# Patient Record
Sex: Male | Born: 1950 | Race: Black or African American | Hispanic: No | Smoking: Former smoker
Health system: Southern US, Community
[De-identification: ages and names within clinical notes are randomized; demographics above are authoritative.]

## PROBLEM LIST (undated history)

## (undated) DIAGNOSIS — H409 Unspecified glaucoma: Secondary | ICD-10-CM

## (undated) DIAGNOSIS — M1990 Unspecified osteoarthritis, unspecified site: Secondary | ICD-10-CM

## (undated) HISTORY — PX: LUNG SURGERY: SHX703

## (undated) HISTORY — PX: LUNG REMOVAL, PARTIAL: SHX233

## (undated) HISTORY — PX: EYE SURGERY: SHX253

---

## 2002-03-02 ENCOUNTER — Encounter: Payer: Self-pay | Admitting: Orthopaedic Surgery

## 2002-03-02 ENCOUNTER — Ambulatory Visit (HOSPITAL_COMMUNITY): Admission: RE | Admit: 2002-03-02 | Discharge: 2002-03-02 | Payer: Self-pay | Admitting: Orthopaedic Surgery

## 2002-10-07 ENCOUNTER — Emergency Department (HOSPITAL_COMMUNITY): Admission: EM | Admit: 2002-10-07 | Discharge: 2002-10-07 | Payer: Self-pay | Admitting: Emergency Medicine

## 2002-10-07 ENCOUNTER — Encounter: Payer: Self-pay | Admitting: Emergency Medicine

## 2017-11-19 ENCOUNTER — Other Ambulatory Visit: Payer: Self-pay | Admitting: Podiatry

## 2017-11-19 ENCOUNTER — Ambulatory Visit (INDEPENDENT_AMBULATORY_CARE_PROVIDER_SITE_OTHER): Payer: Non-veteran care

## 2017-11-19 ENCOUNTER — Encounter: Payer: Self-pay | Admitting: Podiatry

## 2017-11-19 ENCOUNTER — Ambulatory Visit (INDEPENDENT_AMBULATORY_CARE_PROVIDER_SITE_OTHER): Payer: Non-veteran care | Admitting: Podiatry

## 2017-11-19 VITALS — BP 191/94 | HR 63 | Resp 16 | Ht 69.0 in | Wt 192.0 lb

## 2017-11-19 DIAGNOSIS — M2142 Flat foot [pes planus] (acquired), left foot: Principal | ICD-10-CM

## 2017-11-19 DIAGNOSIS — M2042 Other hammer toe(s) (acquired), left foot: Secondary | ICD-10-CM

## 2017-11-19 DIAGNOSIS — M2041 Other hammer toe(s) (acquired), right foot: Secondary | ICD-10-CM

## 2017-11-19 DIAGNOSIS — M2141 Flat foot [pes planus] (acquired), right foot: Secondary | ICD-10-CM | POA: Diagnosis not present

## 2017-11-19 DIAGNOSIS — M722 Plantar fascial fibromatosis: Secondary | ICD-10-CM | POA: Diagnosis not present

## 2017-11-21 NOTE — Progress Notes (Signed)
Subjective:   Patient ID: Jason Hancock, male   DOB: 67 y.o.   MRN: [id number]   HPI 67 year old male presents the office today for concerns of pain to both of his feet.  He states that when he was in the [employer] he had something made to put inside his shoes to help with his arches.  He has not had any recently and he states that he gets pain to the arch of his foot mostly with prolonged walking and standing.  He also gets some numbness and tingling to his feet which is been ongoing since the [employer].  This is not a new issue for him.  He does have history of surgery on his feet.  Denies any swelling or redness.  No recent injury.  No other concerns.   Review of Systems  All other systems reviewed and are negative.  No past medical history on file.     Current Outpatient Medications:  .  ARIPiprazole (ABILIFY) 2 MG tablet, Take 2 mg by mouth daily., Disp: , Rfl:  .  brimonidine (ALPHAGAN) 0.2 % ophthalmic solution, 3 (three) times daily., Disp: , Rfl:  .  budesonide-formoterol (SYMBICORT) 160-4.5 MCG/ACT inhaler, Inhale 2 puffs into the lungs 2 (two) times daily., Disp: , Rfl:  .  fluticasone (FLOVENT DISKUS) 50 MCG/BLIST diskus inhaler, Inhale 1 puff into the lungs 2 (two) times daily., Disp: , Rfl:  .  gabapentin (NEURONTIN) 300 MG capsule, Take 300 mg by mouth 3 (three) times daily., Disp: , Rfl:  .  loratadine (CLARITIN) 10 MG tablet, Take 10 mg by mouth daily., Disp: , Rfl:  .  omeprazole (PRILOSEC) 20 MG capsule, Take 20 mg by mouth daily., Disp: , Rfl:  .  oxycodone (OXY-IR) 5 MG capsule, Take 5 mg by mouth every 4 (four) hours as needed., Disp: , Rfl:  .  sertraline (ZOLOFT) 100 MG tablet, Take 100 mg by mouth daily., Disp: , Rfl:  .  urea (CARMOL) 40 % CREA, Apply topically daily., Disp: , Rfl:   Not on File     Objective:  Physical Exam  General: AAO x3, NAD  Dermatological: Skin is warm, dry and supple bilateral. Nails x 10 are well manicured; remaining integument  appears unremarkable at this time. There are no open sores, no preulcerative lesions, no rash or signs of infection present.  Vascular: Dorsalis Pedis artery and Posterior Tibial artery pedal pulses are 2/4 bilateral with immedate capillary fill time.  There is no pain with calf compression, swelling, warmth, erythema.   Neruologic: Grossly intact via light touch bilateral. Vibratory intact via tuning fork bilateral. Protective threshold with Semmes Wienstein monofilament intact to all pedal sites bilateral. Negative tinel sign.   Musculoskeletal: Significant decrease in medial arch upon weightbearing calcaneal valgus is present.  Subjectively there is discomfort on the medial band plantar fascia the arch of the foot. Achilles tendon/plantar fascial appear intact. There is no area of pinpoint tenderness. No edema.  muscular strength 5/5 in all groups tested bilateral.  Hammertoes present.  Gait: Unassisted, Nonantalgic.      Assessment:   67 year old male with symptomatic flatfoot deformity.    Plan:  -Treatment options discussed including all alternatives, risks, and complications -Etiology of symptoms were discussed -X-rays were obtained and reviewed with the patient.  No evidence of acute fracture or stress fracture identified today. -I do think he will benefit from new custom molded orthotics and he states he had these previously.  [facility]  We discussed shoe modifications. -He has ongoing nerve symptoms as an ongoing for several years.  He is on gabapentin and continue with this.  Trula Slade DPM

## 2017-11-25 ENCOUNTER — Ambulatory Visit: Payer: Non-veteran care | Admitting: Orthotics

## 2017-11-25 DIAGNOSIS — M2041 Other hammer toe(s) (acquired), right foot: Secondary | ICD-10-CM

## 2017-11-25 DIAGNOSIS — M2042 Other hammer toe(s) (acquired), left foot: Secondary | ICD-10-CM

## 2017-11-25 DIAGNOSIS — M2142 Flat foot [pes planus] (acquired), left foot: Principal | ICD-10-CM

## 2017-11-25 DIAGNOSIS — M2141 Flat foot [pes planus] (acquired), right foot: Secondary | ICD-10-CM

## 2017-11-25 NOTE — Progress Notes (Signed)
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  Unable to offer service until we get PO from [place].

## 2017-12-07 ENCOUNTER — Ambulatory Visit (INDEPENDENT_AMBULATORY_CARE_PROVIDER_SITE_OTHER): Payer: Non-veteran care | Admitting: Orthotics

## 2017-12-07 DIAGNOSIS — M2141 Flat foot [pes planus] (acquired), right foot: Secondary | ICD-10-CM

## 2017-12-07 DIAGNOSIS — M722 Plantar fascial fibromatosis: Secondary | ICD-10-CM

## 2017-12-07 DIAGNOSIS — M2142 Flat foot [pes planus] (acquired), left foot: Principal | ICD-10-CM

## 2017-12-07 NOTE — Progress Notes (Signed)

## 2017-12-28 ENCOUNTER — Ambulatory Visit: Payer: Non-veteran care | Admitting: Orthotics

## 2017-12-28 DIAGNOSIS — M2142 Flat foot [pes planus] (acquired), left foot: Principal | ICD-10-CM

## 2017-12-28 DIAGNOSIS — M2141 Flat foot [pes planus] (acquired), right foot: Secondary | ICD-10-CM

## 2017-12-28 NOTE — Progress Notes (Signed)
Patient came in today to pick up custom made foot orthotics.  The goals were accomplished and the patient reported no dissatisfaction with said orthotics.  Patient was advised of breakin period and how to report any issues. 

## 2018-01-06 ENCOUNTER — Encounter (INDEPENDENT_AMBULATORY_CARE_PROVIDER_SITE_OTHER): Payer: Self-pay | Admitting: *Deleted

## 2018-01-31 ENCOUNTER — Emergency Department (HOSPITAL_COMMUNITY)
Admission: EM | Admit: 2018-01-31 | Discharge: 2018-01-31 | Disposition: A | Discharge status: Home / Self Care | Payer: Non-veteran care | Attending: Emergency Medicine | Admitting: Emergency Medicine

## 2018-01-31 ENCOUNTER — Encounter (HOSPITAL_COMMUNITY): Payer: Self-pay | Admitting: Emergency Medicine

## 2018-01-31 ENCOUNTER — Emergency Department (HOSPITAL_COMMUNITY): Payer: Non-veteran care

## 2018-01-31 ENCOUNTER — Other Ambulatory Visit: Payer: Self-pay

## 2018-01-31 DIAGNOSIS — J441 Chronic obstructive pulmonary disease with (acute) exacerbation: Secondary | ICD-10-CM | POA: Diagnosis not present

## 2018-01-31 DIAGNOSIS — Z79899 Other long term (current) drug therapy: Secondary | ICD-10-CM | POA: Insufficient documentation

## 2018-01-31 DIAGNOSIS — Z87891 Personal history of nicotine dependence: Secondary | ICD-10-CM | POA: Insufficient documentation

## 2018-01-31 DIAGNOSIS — R0602 Shortness of breath: Secondary | ICD-10-CM | POA: Diagnosis present

## 2018-01-31 HISTORY — DX: Unspecified osteoarthritis, unspecified site: M19.90

## 2018-01-31 LAB — BASIC METABOLIC PANEL
Anion gap: 5 (ref 5–15)
BUN: 12 mg/dL (ref 8–23)
CO2: 22 mmol/L (ref 22–32)
Calcium: 8.8 mg/dL — ABNORMAL LOW (ref 8.9–10.3)
Chloride: 110 mmol/L (ref 98–111)
Creatinine, Ser: 1.19 mg/dL (ref 0.61–1.24)
GFR calc Af Amer: >60 mL/min (ref >60)
GFR calc non Af Amer: >60 mL/min (ref >60)
Glucose, Bld: 147 mg/dL — ABNORMAL HIGH (ref 70–99)
Potassium: 3.8 mmol/L (ref 3.5–5.1)
Sodium: 137 mmol/L (ref 135–145)

## 2018-01-31 LAB — CBC WITH DIFFERENTIAL/PLATELET
Abs Immature Granulocytes: 0.03 10*3/uL (ref 0.00–0.07)
Basophils Absolute: 0 10*3/uL (ref 0.0–0.1)
Basophils Relative: 0 %
Eosinophils Absolute: 0.2 10*3/uL (ref 0.0–0.5)
Eosinophils Relative: 3 %
HCT: 40.9 % (ref 39.0–52.0)
Hemoglobin: 13.2 g/dL (ref 13.0–17.0)
Immature Granulocytes: 0 %
LYMPHS PCT: 40 %
Lymphs Abs: 2.8 10*3/uL (ref 0.7–4.0)
MCH: 24.8 pg — ABNORMAL LOW (ref 26.0–34.0)
MCHC: 32.3 g/dL (ref 30.0–36.0)
MCV: 76.7 fL — ABNORMAL LOW (ref 80.0–100.0)
Monocytes Absolute: 0.5 10*3/uL (ref 0.1–1.0)
Monocytes Relative: 8 %
Neutro Abs: 3.4 10*3/uL (ref 1.7–7.7)
Neutrophils Relative %: 49 %
Platelets: 186 10*3/uL (ref 150–400)
RBC: 5.33 MIL/uL (ref 4.22–5.81)
RDW: 16.6 % — ABNORMAL HIGH (ref 11.5–15.5)
WBC: 7 10*3/uL (ref 4.0–10.5)
nRBC: 0 % (ref 0.0–0.2)

## 2018-01-31 LAB — TROPONIN I: Troponin I: <0.03 ng/mL (ref <0.03)

## 2018-01-31 MED ORDER — ALBUTEROL SULFATE (2.5 MG/3ML) 0.083% IN NEBU
5.0000 mg | INHALATION_SOLUTION | Freq: Once | RESPIRATORY_TRACT | Status: AC
  Administered 2018-01-31: 5 mg via RESPIRATORY_TRACT
  Filled 2018-01-31: qty 6

## 2018-01-31 MED ORDER — PREDNISONE 20 MG PO TABS: 20.0000 mg | ORAL_TABLET | Freq: Two times a day (BID) | ORAL | 0 refills | Status: DC

## 2018-01-31 MED ORDER — PREDNISONE 50 MG PO TABS
60.0000 mg | ORAL_TABLET | Freq: Once | ORAL | Status: AC
  Administered 2018-01-31: 60 mg via ORAL
  Filled 2018-01-31: qty 1

## 2018-01-31 NOTE — ED Triage Notes (Signed)
Cough and sob x 7 days.  Pain in chest with deep breaths

## 2018-01-31 NOTE — Discharge Instructions (Signed)
Start the prednisone prescription, tomorrow.  Use your albuterol inhaler 2 puffs every 3-4 hours as needed for cough or trouble breathing.  Consider following up with your pulmonologist, in [place], [place], for checkup in 2 to 4 weeks.  Ask them to reevaluate you for an abnormal chest x-ray that we saw today.  Today the x-ray indicates that you may have chronic interstitial lung disease.

## 2018-01-31 NOTE — ED Provider Notes (Signed)
[facility] EMERGENCY DEPARTMENT Provider Note   CSN: [id number] Arrival date & time: 01/31/18  1706     History   Chief Complaint Chief Complaint  Patient presents with  . Shortness of Breath    HPI Jason Hancock is a 68 y.o. male.  HPI   He presents for evaluation of cough, with shortness of breath, for 1 week.  Has been exposed to "RSV," in some relatives.  He denies fever, chills, chest pain, weakness or dizziness.  He is using his usual medicines including Symbicort and albuterol inhalers, without relief.  He is an ex smoker.  He saw a healthcare provider today at a [place] clinic, and was instructed to come to the ED for further evaluation and treatment.  His cough is nonproductive.  He denies nausea, vomiting, headache, paresthesia or focal weakness.  He states that 2 months ago he had lung surgery to remove some nodules, that were found to be "rheumatoid arthritis."  There are no other known modifying factors.  Past Medical History:  Diagnosis Date  . Arthritis     There are no active problems to display for this patient.   Past Surgical History:  Procedure Laterality Date  . LUNG REMOVAL, PARTIAL          Home Medications    Prior to Admission medications   Medication Sig Start Date End Date Taking? Authorizing Provider  albuterol (PROAIR HFA) 108 (90 Base) MCG/ACT inhaler Inhale 1-2 puffs into the lungs every 6 (six) hours as needed for wheezing or shortness of breath.   Yes [provider]  ARIPiprazole (ABILIFY) 2 MG tablet Take 2 mg by mouth daily.   Yes [provider]  brimonidine (ALPHAGAN) 0.2 % ophthalmic solution Place 1 drop into the right eye 3 (three) times daily.    Yes [provider]  budesonide-formoterol (SYMBICORT) 160-4.5 MCG/ACT inhaler Inhale 2 puffs into the lungs daily as needed (for shortness of breath).    Yes [provider]  fluticasone (FLOVENT DISKUS) 50 MCG/BLIST diskus inhaler Inhale 1 puff into the  lungs daily as needed (for shortness of breath).    Yes [provider]  gabapentin (NEURONTIN) 600 MG tablet Take 600 mg by mouth 2 (two) times daily.    Yes [provider]  ketorolac (ACULAR) 0.5 % ophthalmic solution Place 1 drop into the right eye 4 (four) times daily.   Yes [provider]  loratadine (CLARITIN) 10 MG tablet Take 10 mg by mouth every morning.    Yes [provider]  omeprazole (PRILOSEC) 20 MG capsule Take 20 mg by mouth daily.   Yes [provider]  oxyCODONE-acetaminophen (PERCOCET/ROXICET) 5-325 MG tablet Take 2 tablets by mouth every 6 (six) hours.   Yes [provider]  sertraline (ZOLOFT) 100 MG tablet Take 100 mg by mouth daily.   Yes [provider]  timolol (BETIMOL) 0.5 % ophthalmic solution Place 1 drop into the right eye 4 (four) times daily.   Yes [provider]  urea (CARMOL) 40 % CREA Apply topically daily. Applied to feet   Yes [provider]  predniSONE (DELTASONE) 20 MG tablet Take 1 tablet (20 mg total) by mouth 2 (two) times daily. 01/31/18   Daleen Bo, MD    Family History No family history on file.  Social History Social History   Tobacco Use  . Smoking status: Former Smoker    Types: Cigarettes  . Smokeless tobacco: Never Used  . Tobacco  comment: quit 21 years ago   Substance Use Topics  . Alcohol use: Never    Frequency: Never  . Drug use: Never     Allergies   Patient has no known allergies.   Review of Systems Review of Systems  All other systems reviewed and are negative.    Physical Exam Updated Vital Signs BP 97/79   Pulse 63   Temp 98.1 F (36.7 C) (Oral)   Resp 15   Ht 5\' 10"  (1.778 m)   Wt 90.7 kg   SpO2 97%   BMI 28.70 kg/m   Physical Exam Vitals signs and nursing note reviewed.  Constitutional:      General: He is not in acute distress.    Appearance: He is well-developed. He is not ill-appearing, toxic-appearing or  diaphoretic.  HENT:     Head: Normocephalic and atraumatic.     Right Ear: External ear normal.     Left Ear: External ear normal.  Eyes:     Conjunctiva/sclera: Conjunctivae normal.     Pupils: Pupils are equal, round, and reactive to light.  Neck:     Musculoskeletal: Normal range of motion and neck supple.     Trachea: Phonation normal.  Cardiovascular:     Rate and Rhythm: Normal rate and regular rhythm.     Heart sounds: Normal heart sounds.  Pulmonary:     Effort: Pulmonary effort is normal. No respiratory distress.     Breath sounds: No stridor.     Comments: Mild decreased air mild bilaterally with few scattered rhonchi and wheezes.  There is no increased work of breathing. Abdominal:     Palpations: Abdomen is soft.     Tenderness: There is no abdominal tenderness.  Musculoskeletal: Normal range of motion.  Skin:    General: Skin is warm and dry.  Neurological:     Mental Status: He is alert and oriented to person, place, and time.     Cranial Nerves: No cranial nerve deficit.     Sensory: No sensory deficit.     Motor: No abnormal muscle tone.     Coordination: Coordination normal.  Psychiatric:        Behavior: Behavior normal.        Thought Content: Thought content normal.        Judgment: Judgment normal.      ED Treatments / Results  Labs (all labs ordered are listed, but only abnormal results are displayed) Labs Reviewed  BASIC METABOLIC PANEL - Abnormal; Notable for the following components:      Result Value   Glucose, Bld 147 (*)    Calcium 8.8 (*)    All other components within normal limits  CBC WITH DIFFERENTIAL/PLATELET - Abnormal; Notable for the following components:   MCV 76.7 (*)    MCH 24.8 (*)    RDW 16.6 (*)    All other components within normal limits  TROPONIN I    EKG EKG Interpretation  Date/Time:  Monday January 31 2018 17:15:30 EST Ventricular Rate:  64 PR Interval:    QRS Duration: 93 QT Interval:  392 QTC  Calculation: 405 R Axis:   -46 Text Interpretation:  Sinus rhythm Left atrial enlargement Left anterior fascicular block Anterior infarct, old No old tracing to compare Confirmed by Daleen Bo [phone] on 01/31/2018 6:13:55 PM   Radiology Dg Chest 2 View  Result Date: 01/31/2018 CLINICAL DATA:  Cough and shortness of breath for 7 days. EXAM: CHEST - 2 VIEW  COMPARISON:  None. FINDINGS: The heart size and mediastinal contours are normal. There is mild aortic atherosclerosis. There are low lung volumes with coarse interstitial markings, especially at the lung bases. There is blunting of the right costophrenic angle. Surgical clips are present overlying the right lung apex. There is no pneumothorax, confluent airspace opacity or significant pleural effusion. No acute osseous findings are evident. IMPRESSION: Suspected chronic interstitial lung disease and bibasilar atelectasis. No definite acute findings, although no comparison studies are available. Short-term radiographic follow-up suggested. Ultimately, patient may benefit from chest CT. Electronically Signed   By: Richardean Sale M.D.   On: 01/31/2018 18:23    Procedures Procedures (including critical care time)  Medications Ordered in ED Medications  predniSONE (DELTASONE) tablet 60 mg (60 mg Oral Given 01/31/18 1743)  albuterol (PROVENTIL) (2.5 MG/3ML) 0.083% nebulizer solution 5 mg (5 mg Nebulization Given 01/31/18 1841)     Initial Impression / Assessment and Plan / ED Course  I have reviewed the triage vital signs and the nursing notes.  Pertinent labs & imaging results that were available during my care of the patient were reviewed by me and considered in my medical decision making (see chart for details).  Clinical Course as of Jan 31 1949  Mon Jan 31, 2018  1927 Normal  Troponin I - Once [EW]  1927 Normal except glucose high, calcium low  Basic metabolic panel(!) [EW]  5329 Normal except MCV low  CBC with Differential(!) [EW]      Clinical Course User Index [EW] Daleen Bo, MD     Patient Vitals for the past 24 hrs:  BP Temp Temp src Pulse Resp SpO2 Height Weight  01/31/18 1845 - - - - - 97 % - -  01/31/18 1742 97/79 - - 63 15 98 % - -  01/31/18 1717 137/71 98.1 F (36.7 C) Oral 66 19 100 % - -  01/31/18 1712 - - - - - - 5\' 10"  (1.778 m) 90.7 kg    7:49 PM Reevaluation with update and discussion. After initial assessment and treatment, an updated evaluation reveals he states that he feels better after nebulizer treatment.  Findings discussed with the patient and all questions were answered. Daleen Bo   Medical Decision Making: Evaluation consistent with COPD exacerbation.  Abnormal chest x-ray, [place] chronic changes.  Patient recently had a evaluation and removal of nodules," in [place].  He states these were diagnosed as rheumatoid nodules.  Subsequent to that he was placed on a weekly depressive medication.  CRITICAL CARE-no Performed by: Daleen Bo  Nursing Notes Reviewed/ Care Coordinated Applicable Imaging Reviewed Interpretation of Laboratory Data incorporated into ED treatment  The patient appears reasonably screened and/or stabilized for discharge and I doubt any other medical condition or other [facility] requiring further screening, evaluation, or treatment in the ED at this time prior to discharge.  Plan: Home Medications-continue usual medications; Home Treatments-rest, fluids; return here if the recommended treatment, does not improve the symptoms; Recommended follow up-PCP, PRN and discussed follow-up  for abnormal chest x-ray in 2 to 4 weeks.   Final Clinical Impressions(s) / ED Diagnoses   Final diagnoses:  COPD exacerbation [facility])    ED Discharge Orders         Ordered    predniSONE (DELTASONE) 20 MG tablet  2 times daily     01/31/18 1942           Daleen Bo, MD 01/31/18 [id number]

## 2018-03-09 ENCOUNTER — Other Ambulatory Visit (INDEPENDENT_AMBULATORY_CARE_PROVIDER_SITE_OTHER): Payer: Self-pay | Admitting: *Deleted

## 2018-03-09 DIAGNOSIS — Z1211 Encounter for screening for malignant neoplasm of colon: Secondary | ICD-10-CM

## 2018-06-27 ENCOUNTER — Telehealth (INDEPENDENT_AMBULATORY_CARE_PROVIDER_SITE_OTHER): Payer: Self-pay | Admitting: *Deleted

## 2018-06-27 NOTE — Telephone Encounter (Signed)
Referring MD/PCP: [facility] - Dr Nathanial Rancher   Procedure: tcs  Reason/Indication:  screening  Has patient had this procedure before?  Yes, 2010  If so, when, by whom and where?    Is there a family history of colon cancer?  no  Who?  What age when diagnosed?    Is patient diabetic?   no      Does patient have prosthetic heart valve or mechanical valve?  no  Do you have a pacemaker?  no  Has patient ever had endocarditis? no  Has patient had joint replacement within last 12 months?  no  Is patient constipated or do they take laxatives? no  Does patient have a history of alcohol/drug use?  no  Is patient on blood thinner such as Coumadin, Plavix and/or Aspirin? no  Medications: see epic  Allergies: nkda  Medication Adjustment per Dr Lindi Adie, NP:   Procedure date & time: 07/07/18 at 930

## 2018-06-27 NOTE — Telephone Encounter (Signed)
agree

## 2018-06-29 ENCOUNTER — Telehealth (INDEPENDENT_AMBULATORY_CARE_PROVIDER_SITE_OTHER): Payer: Self-pay | Admitting: *Deleted

## 2018-06-29 MED ORDER — PEG 3350-KCL-NA BICARB-NACL 420 G PO SOLR: 4000.0000 mL | Freq: Once | ORAL | 0 refills | Status: AC

## 2018-06-29 NOTE — Telephone Encounter (Signed)
Patient needs trilyte 

## 2018-07-04 ENCOUNTER — Other Ambulatory Visit: Payer: Self-pay

## 2018-07-04 ENCOUNTER — Other Ambulatory Visit (HOSPITAL_COMMUNITY)
Admission: RE | Admit: 2018-07-04 | Discharge: 2018-07-04 | Disposition: A | Discharge status: Home / Self Care | Payer: No Typology Code available for payment source | Source: Ambulatory Visit | Attending: Internal Medicine | Admitting: Internal Medicine

## 2018-07-04 ENCOUNTER — Other Ambulatory Visit (HOSPITAL_COMMUNITY): Payer: Non-veteran care

## 2018-07-04 DIAGNOSIS — Z1159 Encounter for screening for other viral diseases: Secondary | ICD-10-CM | POA: Diagnosis present

## 2018-07-05 LAB — NOVEL CORONAVIRUS, NAA (HOSP ORDER, SEND-OUT TO REF LAB; TAT 18-24 HRS): SARS-CoV-2, NAA: NOT DETECTED

## 2018-07-07 ENCOUNTER — Ambulatory Visit (HOSPITAL_COMMUNITY)
Admission: RE | Admit: 2018-07-07 | Discharge: 2018-07-07 | Disposition: A | Discharge status: Home / Self Care | Payer: No Typology Code available for payment source | Attending: Internal Medicine | Admitting: Internal Medicine

## 2018-07-07 ENCOUNTER — Encounter (HOSPITAL_COMMUNITY): Payer: Self-pay | Admitting: *Deleted

## 2018-07-07 ENCOUNTER — Encounter (HOSPITAL_COMMUNITY)
Admission: RE | Disposition: A | Discharge status: Home / Self Care | Payer: Self-pay | Source: Home / Self Care | Attending: Internal Medicine

## 2018-07-07 ENCOUNTER — Other Ambulatory Visit: Payer: Self-pay

## 2018-07-07 DIAGNOSIS — H409 Unspecified glaucoma: Secondary | ICD-10-CM | POA: Insufficient documentation

## 2018-07-07 DIAGNOSIS — Z87891 Personal history of nicotine dependence: Secondary | ICD-10-CM | POA: Diagnosis not present

## 2018-07-07 DIAGNOSIS — D126 Benign neoplasm of colon, unspecified: Secondary | ICD-10-CM | POA: Insufficient documentation

## 2018-07-07 DIAGNOSIS — Z79899 Other long term (current) drug therapy: Secondary | ICD-10-CM | POA: Diagnosis not present

## 2018-07-07 DIAGNOSIS — K5730 Diverticulosis of large intestine without perforation or abscess without bleeding: Secondary | ICD-10-CM

## 2018-07-07 DIAGNOSIS — Z79891 Long term (current) use of opiate analgesic: Secondary | ICD-10-CM | POA: Insufficient documentation

## 2018-07-07 DIAGNOSIS — Z1211 Encounter for screening for malignant neoplasm of colon: Secondary | ICD-10-CM | POA: Diagnosis not present

## 2018-07-07 DIAGNOSIS — K644 Residual hemorrhoidal skin tags: Secondary | ICD-10-CM | POA: Diagnosis not present

## 2018-07-07 DIAGNOSIS — K219 Gastro-esophageal reflux disease without esophagitis: Secondary | ICD-10-CM | POA: Insufficient documentation

## 2018-07-07 DIAGNOSIS — D123 Benign neoplasm of transverse colon: Secondary | ICD-10-CM | POA: Diagnosis not present

## 2018-07-07 DIAGNOSIS — D122 Benign neoplasm of ascending colon: Secondary | ICD-10-CM | POA: Diagnosis not present

## 2018-07-07 DIAGNOSIS — K635 Polyp of colon: Secondary | ICD-10-CM | POA: Diagnosis not present

## 2018-07-07 HISTORY — PX: POLYPECTOMY: SHX5525

## 2018-07-07 HISTORY — DX: Unspecified glaucoma: H40.9

## 2018-07-07 HISTORY — PX: COLONOSCOPY: SHX5424

## 2018-07-07 HISTORY — PX: BIOPSY: SHX5522

## 2018-07-07 SURGERY — COLONOSCOPY
Anesthesia: Moderate Sedation

## 2018-07-07 MED ORDER — MEPERIDINE HCL 50 MG/ML IJ SOLN
INTRAMUSCULAR | Status: DC | PRN
  Administered 2018-07-07 (×2): 25 mg

## 2018-07-07 MED ORDER — MIDAZOLAM HCL 5 MG/5ML IJ SOLN
INTRAMUSCULAR | Status: DC | PRN
  Administered 2018-07-07: 1 mg via INTRAVENOUS
  Administered 2018-07-07 (×2): 2 mg via INTRAVENOUS

## 2018-07-07 MED ORDER — MIDAZOLAM HCL 5 MG/5ML IJ SOLN
INTRAMUSCULAR | Status: AC
  Filled 2018-07-07: qty 10

## 2018-07-07 MED ORDER — STERILE WATER FOR IRRIGATION IR SOLN
Status: DC | PRN
  Administered 2018-07-07: 5 mL

## 2018-07-07 MED ORDER — SODIUM CHLORIDE 0.9 % IV SOLN
INTRAVENOUS | Status: DC
  Administered 2018-07-07: 09:00:00 via INTRAVENOUS

## 2018-07-07 MED ORDER — MEPERIDINE HCL 50 MG/ML IJ SOLN
INTRAMUSCULAR | Status: AC
  Filled 2018-07-07: qty 1

## 2018-07-07 NOTE — H&P (Signed)
Jason Hancock is an 68 y.o. male.   Chief Complaint: Patient is here for colonoscopy. HPI: Patient is 68 year old Afro-American male who is a for screening colonoscopy.  He denies abdominal pain change in bowel habits or rectal bleeding.  He states his last colonoscopy was in 2005 or 2006 in [place] and he had small polyp removed.  He is not sure if he was advised to come back in 5 to 10 years. Family history is negative for CRC.  Past Medical History:  Diagnosis Date  . Arthritis   . Glaucoma        GERD      Chronic back pain.  Has multiple bulging disks in cervical and lumbar       region.       Rheumatoid lung disease  Past Surgical History:  Procedure Laterality Date  . EYE SURGERY Bilateral    cataract removal  . LUNG REMOVAL, PARTIAL    . LUNG SURGERY Right     History reviewed. No pertinent family history. Social History:  reports that he has quit smoking. His smoking use included cigarettes. He has never used smokeless tobacco. He reports that he does not drink alcohol or use drugs.  Allergies: No Known Allergies  Medications Prior to Admission  Medication Sig Dispense Refill  . albuterol (PROAIR HFA) 108 (90 Base) MCG/ACT inhaler Inhale 1-2 puffs into the lungs every 6 (six) hours as needed for wheezing or shortness of breath.    . ARIPiprazole (ABILIFY) 2 MG tablet Take 2 mg by mouth daily.    . brimonidine (ALPHAGAN) 0.2 % ophthalmic solution Place 1 drop into both eyes 2 (two) times a day.     . Cholecalciferol (VITAMIN D3) 50 MCG (2000 UT) TABS Take 2,000 Units by mouth daily.    . fluticasone (FLONASE) 50 MCG/ACT nasal spray Place 1 spray into both nostrils 2 (two) times daily as needed for allergies or rhinitis.    Marland Kitchen gabapentin (NEURONTIN) 300 MG capsule Take 600 mg by mouth 2 (two) times a day.    . latanoprost (XALATAN) 0.005 % ophthalmic solution Place 1 drop into both eyes at bedtime.    Marland Kitchen loratadine (CLARITIN) 10 MG tablet Take 10 mg by mouth every  morning.     Marland Kitchen omeprazole (PRILOSEC) 20 MG capsule Take 20 mg by mouth daily.    Marland Kitchen oxyCODONE-acetaminophen (PERCOCET/ROXICET) 5-325 MG tablet Take 1-2 tablets by mouth 4 (four) times daily.     . sertraline (ZOLOFT) 100 MG tablet Take 100 mg by mouth daily.    . timolol (BETIMOL) 0.5 % ophthalmic solution Place 1 drop into the right eye 2 (two) times a day.     . urea (CARMOL) 40 % CREA Apply 1 application topically daily as needed (foot pain.). Applied to feet    . budesonide-formoterol (SYMBICORT) 160-4.5 MCG/ACT inhaler Inhale 2 puffs into the lungs daily as needed (for shortness of breath).     . predniSONE (DELTASONE) 20 MG tablet Take 1 tablet (20 mg total) by mouth 2 (two) times daily. (Patient not taking: Reported on 07/05/2018) 10 tablet 0    No results found for this or any previous visit (from the past 48 hour(s)). No results found.  ROS  Blood pressure (!) 145/56, pulse 63, temperature 98.2 F (36.8 C), temperature source Oral, resp. rate (!) 23, height 5\' 9"  (1.753 m), weight 93 kg, SpO2 99 %. Physical Exam  Constitutional: He appears well-developed and well-nourished.  HENT:  Mouth/Throat:  Oropharynx is clear and moist.  Eyes: Conjunctivae are normal. No scleral icterus.  Neck: No thyromegaly present.  Cardiovascular: Normal rate, regular rhythm and normal heart sounds.  No murmur heard. Respiratory: Effort normal and breath sounds normal.  GI: Soft. He exhibits no distension and no mass. There is no abdominal tenderness. There is no rebound.  Musculoskeletal:        General: No edema.  Lymphadenopathy:    He has no cervical adenopathy.  Neurological: He is alert.  Skin: Skin is warm and dry.     Assessment/Plan Average risk screening colonoscopy.  Hildred Laser, MD 07/07/2018, 9:06 AM

## 2018-07-07 NOTE — Op Note (Signed)
[facility] Patient Name: Jason Hancock Procedure Date: 07/07/2018 8:59 AM MRN: [id number] Date of Birth: Jun 27, 1950 Attending MD: Hildred Laser , MD CSN: [id number] Age: 68 Admit Type: Outpatient Procedure:                Colonoscopy Indications:              Screening for colorectal malignant neoplasm Providers:                Hildred Laser, MD, Janeece Riggers, RN, Raphael Gibney,                            Technician Referring MD:             Blane Ohara, MD Medicines:                Meperidine 50 mg IV, Midazolam 5 mg IV Complications:            No immediate complications. Estimated Blood Loss:     Estimated blood loss was minimal. Procedure:                Pre-Anesthesia Assessment:                           - Prior to the procedure, a History and Physical                            was performed, and patient medications and                            allergies were reviewed. The patient's tolerance of                            previous anesthesia was also reviewed. The risks                            and benefits of the procedure and the sedation                            options and risks were discussed with the patient.                            All questions were answered, and informed consent                            was obtained. Prior Anticoagulants: The patient has                            taken no previous anticoagulant or antiplatelet                            agents. ASA Grade Assessment: II - A patient with                            mild systemic disease. After reviewing the risks  and benefits, the patient was deemed in                            satisfactory condition to undergo the procedure.                           After obtaining informed consent, the colonoscope                            was passed under direct vision. Throughout the                            procedure, the patient's blood pressure, pulse, and                    oxygen saturations were monitored continuously. The                            [id number]-[id number] ([id number]) scope was introduced through                            the anus and advanced to the the cecum, identified                            by appendiceal orifice and ileocecal valve. The                            colonoscopy was performed without difficulty. The                            patient tolerated the procedure well. The quality                            of the bowel preparation was good. The ileocecal                            valve, appendiceal orifice, and rectum were                            photographed. Scope In: 5:42:70 AM Scope Out: 9:34:19 AM Scope Withdrawal Time: 0 hours 10 minutes 54 seconds  Total Procedure Duration: 0 hours 17 minutes 32 seconds  Findings:      Skin tags were found on perianal exam.      A 4 mm polyp was found in the mid ascending colon. The polyp was       sessile. The polyp was removed with a cold snare. Resection and       retrieval were complete. The pathology specimen was placed into Bottle       Number 1.      A small polyp was found in the splenic flexure. The polyp was sessile.       Biopsies were taken with a cold forceps for histology. The pathology       specimen was placed into Bottle Number 1.      A few small-mouthed diverticula were found in the sigmoid colon.  External hemorrhoids were found during retroflexion. The hemorrhoids       were small. Impression:               - Perianal skin tags found on perianal exam.                           - One 4 mm polyp in the mid ascending colon,                            removed with a cold snare. Resected and retrieved.                           - One small polyp at the splenic flexure. Biopsied.                           - Diverticulosis in the sigmoid colon.                           - External hemorrhoids. Moderate Sedation:      Moderate (conscious) sedation was  administered by the endoscopy nurse       and supervised by the endoscopist. The following parameters were       monitored: oxygen saturation, heart rate, blood pressure, CO2       capnography and response to care. Total physician intraservice time was       23 minutes. Recommendation:           - Patient has a contact number available for                            emergencies. The signs and symptoms of potential                            delayed complications were discussed with the                            patient. Return to normal activities tomorrow.                            Written discharge instructions were provided to the                            patient.                           - High fiber diet today.                           - Continue present medications.                           - No aspirin, ibuprofen, naproxen, or other                            non-steroidal anti-inflammatory drugs for 1 day.                           -  Await pathology results.                           - Repeat colonoscopy is recommended. The                            colonoscopy date will be determined after pathology                            results from today's exam become available for                            review. Procedure Code(s):        --- Professional ---                           [phone], Colonoscopy, flexible; with removal of                            tumor(s), polyp(s), or other lesion(s) by snare                            technique                           45380, 59, Colonoscopy, flexible; with biopsy,                            single or multiple                           99153, Moderate sedation; each additional 15                            minutes intraservice time                           G0500, Moderate sedation services provided by the                            same physician or other qualified health care                            professional performing a  gastrointestinal                            endoscopic service that sedation supports,                            requiring the presence of an independent trained                            observer to assist in the monitoring of the                            patient's level of consciousness and physiological  status; initial 15 minutes of intra-service time;                            patient age 31 years or older (additional time may                            be reported with [phone], as appropriate) Diagnosis Code(s):        --- Professional ---                           K64.4, Residual hemorrhoidal skin tags                           Z12.11, Encounter for screening for malignant                            neoplasm of colon                           K63.5, Polyp of colon                           K57.30, Diverticulosis of large intestine without                            perforation or abscess without bleeding CPT copyright 2019 American Medical Association. All rights reserved. The codes documented in this report are preliminary and upon coder review may  be revised to meet current compliance requirements. Hildred Laser, MD Hildred Laser, MD 07/07/2018 9:43:54 AM This report has been signed electronically. Number of Addenda: 0

## 2018-07-07 NOTE — OR Nursing (Signed)
Due to COVID-19 restrictions, discharge instructions discussed with daughter, Yacoub Diltz. She verbalized understanding. Denies any further questions. Discharge packet instructions handed to daughter as well.

## 2018-07-07 NOTE — Discharge Instructions (Signed)
No aspirin or NSAIDs for 24 hours. Resume usual medications as before. High-fiber diet. No driving for 24 hours. Physician will call with biopsy results.   Hemorrhoids Hemorrhoids are swollen veins that may develop:  In the butt (rectum). These are called internal hemorrhoids.  Around the opening of the butt (anus). These are called external hemorrhoids. Hemorrhoids can cause pain, itching, or bleeding. Most of the time, they do not cause serious problems. They usually get better with diet changes, lifestyle changes, and other home treatments. What are the causes? This condition may be caused by:  Having trouble pooping (constipation).  Pushing hard (straining) to poop.  Watery poop (diarrhea).  Pregnancy.  Being very overweight (obese).  Sitting for long periods of time.  Heavy lifting or other activity that causes you to strain.  Anal sex.  Riding a bike for a long period of time. What are the signs or symptoms? Symptoms of this condition include:  Pain.  Itching or soreness in the butt.  Bleeding from the butt.  Leaking poop.  Swelling in the area.  One or more lumps around the opening of your butt. How is this diagnosed? A doctor can often diagnose this condition by looking at the affected area. The doctor may also:  Do an exam that involves feeling the area with a gloved hand (digital rectal exam).  Examine the area inside your butt using a small tube (anoscope).  Order blood tests. This may be done if you have lost a lot of blood.  Have you get a test that involves looking inside the colon using a flexible tube with a camera on the end (sigmoidoscopy or colonoscopy). How is this treated? This condition can usually be treated at home. Your doctor may tell you to change what you eat, make lifestyle changes, or try home treatments. If these do not help, procedures can be done to remove the hemorrhoids or make them smaller. These may involve:  Placing  rubber bands at the base of the hemorrhoids to cut off their blood supply.  Injecting medicine into the hemorrhoids to shrink them.  Shining a type of light energy onto the hemorrhoids to cause them to fall off.  Doing surgery to remove the hemorrhoids or cut off their blood supply. Follow these instructions at home: Eating and drinking   Eat foods that have a lot of fiber in them. These include whole grains, beans, nuts, fruits, and vegetables.  Ask your doctor about taking products that have added fiber (fibersupplements).  Reduce the amount of fat in your diet. You can do this by: ? Eating low-fat dairy products. ? Eating less red meat. ? Avoiding processed foods.  Drink enough fluid to keep your pee (urine) pale yellow. Managing pain and swelling   Take a warm-water bath (sitz bath) for 20 minutes to ease pain. Do this 3-4 times a day. You may do this in a bathtub or using a portable sitz bath that fits over the toilet.  If told, put ice on the painful area. It may be helpful to use ice between your warm baths. ? Put ice in a plastic bag. ? Place a towel between your skin and the bag. ? Leave the ice on for 20 minutes, 2-3 times a day. General instructions  Take over-the-counter and prescription medicines only as told by your doctor. ? Medicated creams and medicines may be used as told.  Exercise often. Ask your doctor how much and what kind of exercise is best for  you.  Go to the bathroom when you have the urge to poop. Do not wait.  Avoid pushing too hard when you poop.  Keep your butt dry and clean. Use wet toilet paper or moist towelettes after pooping.  Do not sit on the toilet for a long time.  Keep all follow-up visits as told by your doctor. This is important. Contact a doctor if you:  Have pain and swelling that do not get better with treatment or medicine.  Have trouble pooping.  Cannot poop.  Have pain or swelling outside the area of the  hemorrhoids. Get help right away if you have:  Bleeding that will not stop. Summary  Hemorrhoids are swollen veins in the butt or around the opening of the butt.  They can cause pain, itching, or bleeding.  Eat foods that have a lot of fiber in them. These include whole grains, beans, nuts, fruits, and vegetables.  Take a warm-water bath (sitz bath) for 20 minutes to ease pain. Do this 3-4 times a day. This information is not intended to replace advice given to you by your health care provider. Make sure you discuss any questions you have with your health care provider. Document Released: 10/22/2007 Document Revised: 06/03/2017 Document Reviewed: 06/03/2017 Elsevier Interactive Patient Education  2019 [place].  Colonoscopy, Adult, Care After This sheet gives you information about how to care for yourself after your procedure. Your doctor may also give you more specific instructions. If you have problems or questions, call your doctor. What can I expect after the procedure? After the procedure, it is common to have:  A small amount of blood in your poop for 24 hours.  Some gas.  Mild cramping or bloating in your belly. Follow these instructions at home: General instructions  For the first 24 hours after the procedure: ? Do not drive or use machinery. ? Do not sign important documents. ? Do not drink alcohol. ? Do your daily activities more slowly than normal. ? Eat foods that are soft and easy to digest.  Take over-the-counter or prescription medicines only as told by your doctor. To help cramping and bloating:   Try walking around.  Put heat on your belly (abdomen) as told by your doctor. Use a heat source that your doctor recommends, such as a moist heat pack or a heating pad. ? Put a towel between your skin and the heat source. ? Leave the heat on for 20-30 minutes. ? Remove the heat if your skin turns bright red. This is especially important if you cannot feel  pain, heat, or cold. You can get burned. Eating and drinking   Drink enough fluid to keep your pee (urine) clear or pale yellow.  Return to your normal diet as told by your doctor. Avoid heavy or fried foods that are hard to digest.  Avoid drinking alcohol for as long as told by your doctor. Contact a doctor if:  You have blood in your poop (stool) 2-3 days after the procedure. Get help right away if:  You have more than a small amount of blood in your poop.  You see large clumps of tissue (blood clots) in your poop.  Your belly is swollen.  You feel sick to your stomach (nauseous).  You throw up (vomit).  You have a fever.  You have belly pain that gets worse, and medicine does not help your pain. Summary  After the procedure, it is common to have a small amount of blood  in your poop. You may also have mild cramping and bloating in your belly.  For the first 24 hours after the procedure, do not drive or use machinery, do not sign important documents, and do not drink alcohol.  Get help right away if you have a lot of blood in your poop, feel sick to your stomach, have a fever, or have more belly pain. This information is not intended to replace advice given to you by your health care provider. Make sure you discuss any questions you have with your health care provider. Document Released: 02/14/2010 Document Revised: 11/12/2016 Document Reviewed: 10/07/2015 Elsevier Interactive Patient Education  2019 [place].  Colon Polyps  Polyps are tissue growths inside the body. Polyps can grow in many places, including the large intestine (colon). A polyp may be a round bump or a mushroom-shaped growth. You could have one polyp or several. Most colon polyps are noncancerous (benign). However, some colon polyps can become cancerous over time. Finding and removing the polyps early can help prevent this. What are the causes? The exact cause of colon polyps is not known. What  increases the risk? You are more likely to develop this condition if you:  Have a family history of colon cancer or colon polyps.  Are older than 87 or older than 45 if you are African American.  Have inflammatory bowel disease, such as ulcerative colitis or Crohn's disease.  Have certain hereditary conditions, such as: ? Familial adenomatous polyposis. ? Lynch syndrome. ? Turcot syndrome. ? Peutz-Jeghers syndrome.  Are overweight.  Smoke cigarettes.  Do not get enough exercise.  Drink too much alcohol.  Eat a diet that is high in fat and red meat and low in fiber.  Had childhood cancer that was treated with abdominal radiation. What are the signs or symptoms? Most polyps do not cause symptoms. If you have symptoms, they may include:  Blood coming from your rectum when having a bowel movement.  Blood in your stool. The stool may look dark red or black.  Abdominal pain.  A change in bowel habits, such as constipation or diarrhea. How is this diagnosed? This condition is diagnosed with a colonoscopy. This is a procedure in which a lighted, flexible scope is inserted into the anus and then passed into the colon to examine the area. Polyps are sometimes found when a colonoscopy is done as part of routine cancer screening tests. How is this treated? Treatment for this condition involves removing any polyps that are found. Most polyps can be removed during a colonoscopy. Those polyps will then be tested for cancer. Additional treatment may be needed depending on the results of testing. Follow these instructions at home: Lifestyle  Maintain a healthy weight, or lose weight if recommended by your health care provider.  Exercise every day or as told by your health care provider.  Do not use any products that contain nicotine or tobacco, such as cigarettes and e-cigarettes. If you need help quitting, ask your health care provider.  If you drink alcohol, limit how much you  have: ? 0-1 drink a day for women. ? 0-2 drinks a day for men.  Be aware of how much alcohol is in your drink. In the [place] one drink equals one 12 oz bottle of beer (355 mL), one 5 oz glass of wine (148 mL), or one 1 oz shot of hard liquor (44 mL). Eating and drinking   Eat foods that are high in fiber, such as fruits, vegetables,  and whole grains.  Eat foods that are high in calcium and vitamin D, such as milk, cheese, yogurt, eggs, liver, fish, and broccoli.  Limit foods that are high in fat, such as fried foods and desserts.  Limit the amount of red meat and processed meat you eat, such as hot dogs, sausage, bacon, and lunch meats. General instructions  Keep all follow-up visits as told by your health care provider. This is important. ? This includes having regularly scheduled colonoscopies. ? Talk to your health care provider about when you need a colonoscopy. Contact a health care provider if:  You have new or worsening bleeding during a bowel movement.  You have new or increased blood in your stool.  You have a change in bowel habits.  You lose weight for no known reason. Summary  Polyps are tissue growths inside the body. Polyps can grow in many places, including the colon.  Most colon polyps are noncancerous (benign), but some can become cancerous over time.  This condition is diagnosed with a colonoscopy.  Treatment for this condition involves removing any polyps that are found. Most polyps can be removed during a colonoscopy. This information is not intended to replace advice given to you by your health care provider. Make sure you discuss any questions you have with your health care provider. Document Released: 10/09/2003 Document Revised: 04/29/2017 Document Reviewed: 04/29/2017 Elsevier Interactive Patient Education  2019 [employer].

## 2018-07-13 ENCOUNTER — Encounter (HOSPITAL_COMMUNITY): Payer: Self-pay | Admitting: Internal Medicine

## 2018-12-02 ENCOUNTER — Other Ambulatory Visit: Payer: Self-pay

## 2018-12-02 DIAGNOSIS — Z20822 Contact with and (suspected) exposure to covid-19: Secondary | ICD-10-CM

## 2018-12-04 LAB — NOVEL CORONAVIRUS, NAA: SARS-CoV-2, NAA: NOT DETECTED

## 2019-03-09 ENCOUNTER — Ambulatory Visit: Payer: Non-veteran care | Attending: Internal Medicine

## 2019-03-09 DIAGNOSIS — Z23 Encounter for immunization: Secondary | ICD-10-CM | POA: Insufficient documentation

## 2019-03-09 NOTE — Progress Notes (Signed)
   Covid-19 Vaccination Clinic  Name:  VIPUL MCCLEAF    MRN: [id number] DOB: 1950-07-04  03/09/2019  Mr. Agne was observed post Covid-19 immunization for 15 minutes without incidence. He was provided with Vaccine Information Sheet and instruction to access the V-Safe system.   Mr. Virola was instructed to call 911 with any severe reactions post vaccine: Marland Kitchen Difficulty breathing  . Swelling of your face and throat  . A fast heartbeat  . A bad rash all over your body  . Dizziness and weakness    Immunizations Administered    Name Date Dose VIS Date Route   Moderna COVID-19 Vaccine 03/09/2019  9:01 AM 0.5 mL 12/27/2018 Intramuscular   Manufacturer: Moderna   Lot: [id number]   [id number]

## 2019-04-10 ENCOUNTER — Ambulatory Visit: Payer: Non-veteran care | Attending: Internal Medicine

## 2019-04-10 DIAGNOSIS — Z23 Encounter for immunization: Secondary | ICD-10-CM

## 2019-04-10 NOTE — Progress Notes (Signed)
   Covid-19 Vaccination Clinic  Name:  JAVION MASTALSKI    MRN: [id number] DOB: 1950-09-09  04/10/2019  Mr. Narro was observed post Covid-19 immunization for 15 minutes without incident. He was provided with Vaccine Information Sheet and instruction to access the V-Safe system.   Mr. Mazeika was instructed to call 911 with any severe reactions post vaccine: Marland Kitchen Difficulty breathing  . Swelling of face and throat  . A fast heartbeat  . A bad rash all over body  . Dizziness and weakness   Immunizations Administered    Name Date Dose VIS Date Route   Moderna COVID-19 Vaccine 04/10/2019  9:08 AM 0.5 mL 12/27/2018 Intramuscular   Manufacturer: Moderna   Lot: [id number]   [id number]

## 2019-09-18 IMAGING — DX DG CHEST 2V
2 series · 2 of 2 positions shown · non-contrast
Comparison: None.

CLINICAL DATA: Cough and shortness of breath for 7 days.

EXAM:
CHEST - 2 VIEW

[chest pa]
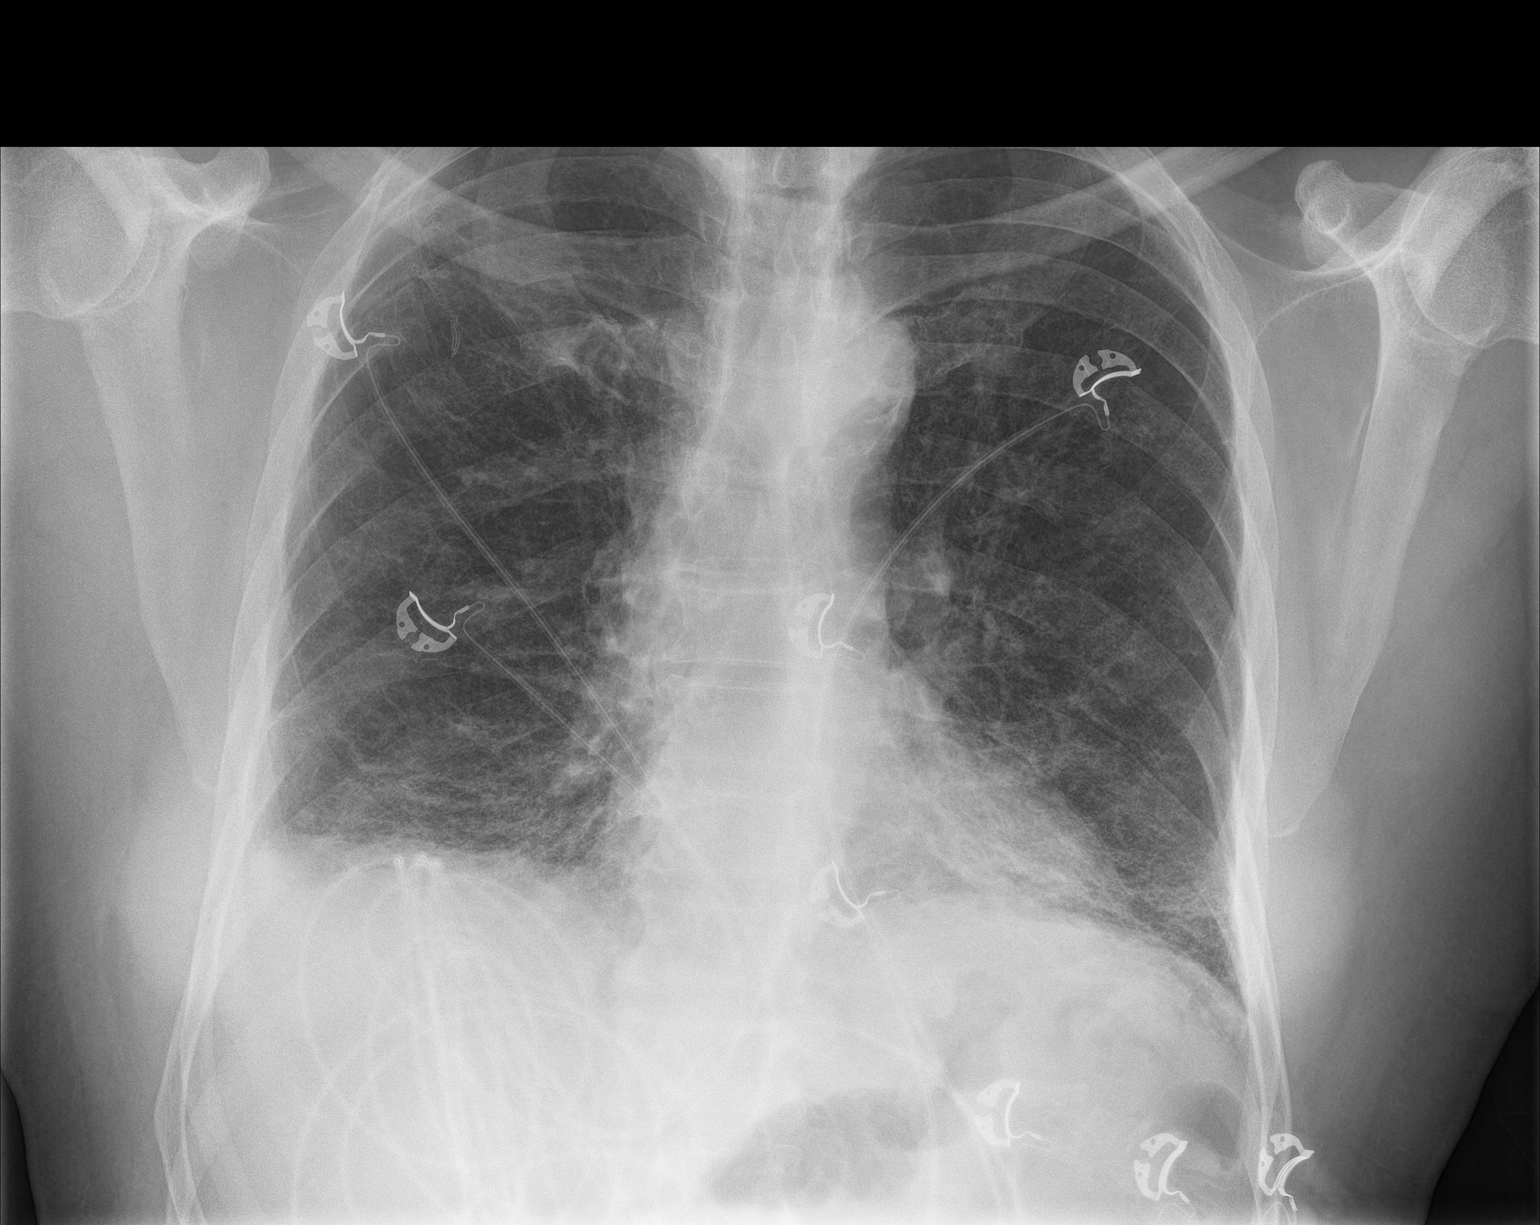

[chest lat]
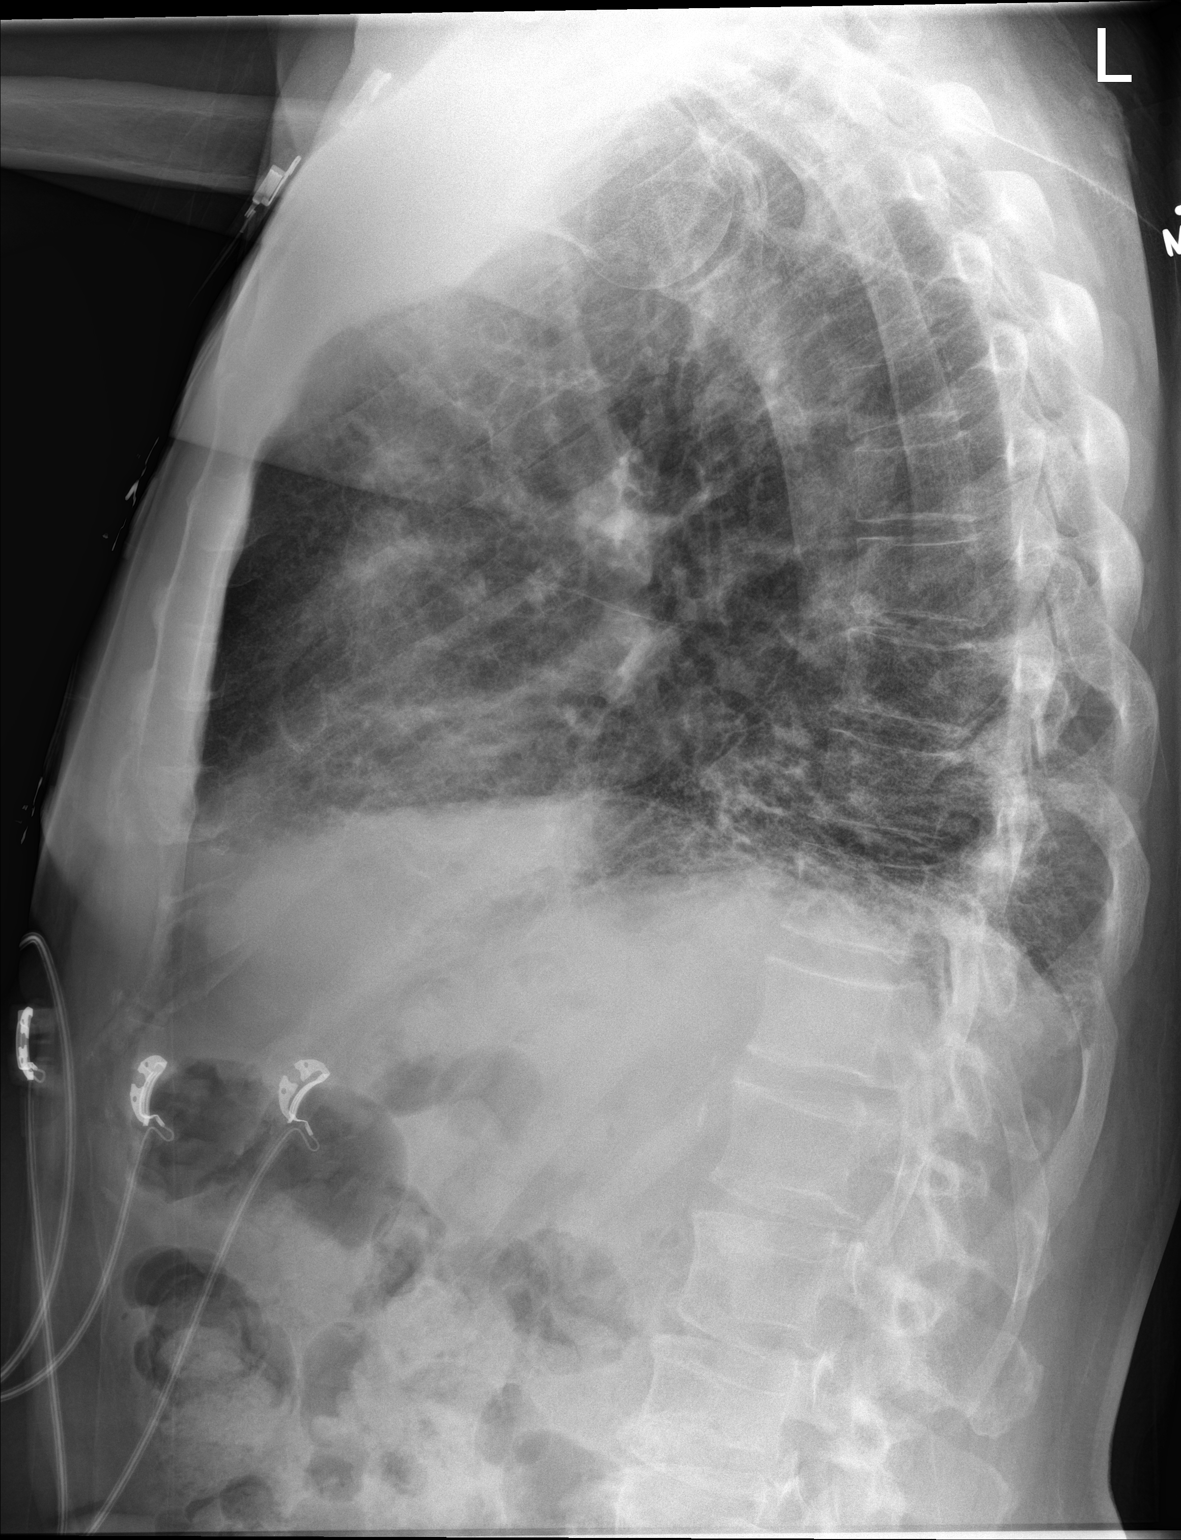

[2 of 2 positions shown; findings below may reference images not displayed]

FINDINGS: The heart size and mediastinal contours are normal. There is mild
aortic atherosclerosis. There are low lung volumes with coarse
interstitial markings, especially at the lung bases. There is
blunting of the right costophrenic angle. Surgical clips are present
overlying the right lung apex. There is no pneumothorax, confluent
airspace opacity or significant pleural effusion. No acute osseous
findings are evident.
IMPRESSION: Suspected chronic interstitial lung disease and bibasilar
atelectasis. No definite acute findings, although no comparison
studies are available. Short-term radiographic follow-up suggested.
Ultimately, patient may benefit from chest CT.

## 2021-04-22 ENCOUNTER — Other Ambulatory Visit (HOSPITAL_COMMUNITY): Payer: Self-pay | Admitting: Pulmonary Disease

## 2021-04-22 DIAGNOSIS — J849 Interstitial pulmonary disease, unspecified: Secondary | ICD-10-CM

## 2021-05-22 ENCOUNTER — Ambulatory Visit (HOSPITAL_COMMUNITY)
Admission: RE | Admit: 2021-05-22 | Discharge: 2021-05-22 | Disposition: A | Discharge status: Home / Self Care | Payer: No Typology Code available for payment source | Source: Ambulatory Visit | Attending: Pulmonary Disease | Admitting: Pulmonary Disease

## 2021-05-22 DIAGNOSIS — J849 Interstitial pulmonary disease, unspecified: Secondary | ICD-10-CM | POA: Insufficient documentation

## 2022-05-08 ENCOUNTER — Encounter: Payer: Self-pay | Admitting: Podiatry

## 2022-05-08 ENCOUNTER — Ambulatory Visit (INDEPENDENT_AMBULATORY_CARE_PROVIDER_SITE_OTHER): Payer: No Typology Code available for payment source | Admitting: Podiatry

## 2022-05-08 DIAGNOSIS — E1151 Type 2 diabetes mellitus with diabetic peripheral angiopathy without gangrene: Secondary | ICD-10-CM | POA: Diagnosis not present

## 2022-05-08 DIAGNOSIS — L84 Corns and callosities: Secondary | ICD-10-CM

## 2022-05-10 NOTE — Progress Notes (Signed)
Subjective:   Patient ID: Jason Hancock, male   DOB: 72 y.o.   MRN: [id number]   HPI Patient presents with painful lesion right over left foot.  States that they are very sore hard to walk on and that it is gradually become more of an issue with patient having diabetes of the long-term duration   ROS      Objective:  Physical Exam  Neurological intact diminished pulses DP PT pulses bilateral diminished hair growth and digital perfusion.  Patient is found to have severe keratotic lesion subfourth metatarsal right and subfifth metatarsal left painful when pressed which make walking difficult.     Assessment:  High risk individual with long-term diabetes and moderate diminishment of vascular status with no claudication symptoms when high risk individual with long-term diabetes and moderate diminishment of vascular status with no claudication symptoms when questioned with chronic lesion painful bilateral     Plan:  H&P reviewed condition and went ahead today and did sterile debridement of lesions bilateral no iatrogenic bleeding reappoint routine care or earlier if needed
